# Patient Record
Sex: Male | Born: 1980 | Race: White | Hispanic: No | Marital: Married
Health system: Southern US, Community
[De-identification: ages and names within clinical notes are randomized; demographics above are authoritative.]

---

## 2015-10-15 ENCOUNTER — Emergency Department (HOSPITAL_COMMUNITY)
Admission: EM | Admit: 2015-10-15 | Discharge: 2015-10-15 | Disposition: A | Discharge status: Home / Self Care | Payer: Self-pay | Attending: Emergency Medicine | Admitting: Emergency Medicine

## 2015-10-15 ENCOUNTER — Encounter (HOSPITAL_COMMUNITY): Payer: Self-pay | Admitting: Emergency Medicine

## 2015-10-15 ENCOUNTER — Emergency Department (HOSPITAL_COMMUNITY): Payer: Self-pay

## 2015-10-15 DIAGNOSIS — Y999 Unspecified external cause status: Secondary | ICD-10-CM | POA: Insufficient documentation

## 2015-10-15 DIAGNOSIS — M5116 Intervertebral disc disorders with radiculopathy, lumbar region: Secondary | ICD-10-CM | POA: Insufficient documentation

## 2015-10-15 DIAGNOSIS — Y929 Unspecified place or not applicable: Secondary | ICD-10-CM | POA: Insufficient documentation

## 2015-10-15 DIAGNOSIS — Y9389 Activity, other specified: Secondary | ICD-10-CM | POA: Insufficient documentation

## 2015-10-15 DIAGNOSIS — M5126 Other intervertebral disc displacement, lumbar region: Secondary | ICD-10-CM

## 2015-10-15 DIAGNOSIS — M5136 Other intervertebral disc degeneration, lumbar region: Secondary | ICD-10-CM | POA: Insufficient documentation

## 2015-10-15 DIAGNOSIS — Z791 Long term (current) use of non-steroidal anti-inflammatories (NSAID): Secondary | ICD-10-CM | POA: Insufficient documentation

## 2015-10-15 DIAGNOSIS — Z79899 Other long term (current) drug therapy: Secondary | ICD-10-CM | POA: Insufficient documentation

## 2015-10-15 MED ORDER — ONDANSETRON HCL 8 MG PO TABS: 8.0000 mg | ORAL_TABLET | Freq: Three times a day (TID) | ORAL | Status: AC | PRN

## 2015-10-15 MED ORDER — PREDNISONE 20 MG PO TABS: 20.0000 mg | ORAL_TABLET | Freq: Two times a day (BID) | ORAL | Status: AC

## 2015-10-15 MED ORDER — OXYCODONE-ACETAMINOPHEN 5-325 MG PO TABS: 1.0000 | ORAL_TABLET | ORAL | Status: AC | PRN
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
## 2015-10-15 NOTE — ED Notes (Signed)
Pt states he does not take narcotic pain meds due to how nauseated they make him.

## 2015-10-15 NOTE — ED Notes (Signed)
Patient being transported to MRI at this time.

## 2015-10-15 NOTE — Discharge Instructions (Signed)
Use heat on her back, to help the discomfort. Do not drive when taking the pain medication. After the prednisone runs out, start ibuprofen, 400 mg 3 times a day to help with the pain. Call the neurosurgeon, for a follow-up appointment for further care.    Herniated Disk A herniated disk occurs when a disk in your spine bulges out too far. This condition is also called a ruptured disk or slipped disk. Your spine (backbone) is made up of bones called vertebrae. Between each pair of vertebrae is an oval disk with a soft, spongy center that acts as a shock absorber when you move. The spongy center is surrounded by a tough outer ring. When you have a herniated disk, the spongy center of the disk bulges out or ruptures through the outer ring. A herniated disk can press on a nerve between your vertebrae and cause pain. A herniated disk can occur anywhere in your back or neck area, but the lower back is the most common spot. CAUSES  In many cases, a herniated disk occurs just from getting older. As you age, the spongy insides of your disks tend to shrink and dry out. A herniated disk can result from gradual wear and tear. Injury or sudden strain can also cause a herniated disk.  RISK FACTORS Aging is the main risk factor for a herniated disk. Other risk factors include: 1. Being a man between the ages of 50 and 89 years. 2. Having a job that requires heavy lifting, bending, or twisting. 3. Having a job that requires long hours of driving. 4. Not getting enough exercise. 5. Being overweight. 6. Smoking. SIGNS AND SYMPTOMS  Signs and symptoms depend on which disk is herniated. 1. For a herniated disk in the lower back, you may have sharp pain in: 1. One part of your leg, hip, or buttocks. 2. The back of your calf. 3. The top or sole of your foot (sciatica).  2. For a herniated disk in the neck, you may feel pain: 1. When you move your neck. 2. Near or over your shoulder blade. 3. That moves to  your upper arm, forearm, or fingers.  3. You may also have muscle weakness. It may be hard to: 1. Lift your leg or arm. 2. Stand on your toes. 3. Squeeze tightly with one of your hands. 4. Other symptoms can include: 1. Numbness or tingling in the affected areas of your body. 2. Loss of bladder or bowel control. This is a rare but serious sign of a severe herniated disk in the lower back. DIAGNOSIS  Your health care provider will do a physical exam. During this exam, you may have to move certain body parts or assume various positions. For example, your health care provider may do the straight-leg test. This is a good way to test for a herniated disk in your lower back. In this test, the health care provider lifts your leg while you lie on your back. This is to see if you feel pain down your leg. Your health care provider will also check for numbness or loss of feeling. 1. Your health care provider will also check your: 1. Reflexes. 2. Muscle strength. 3. Posture. 2. Other tests may be done to help in making a diagnosis. These may include: 1. An X-ray of the spine to rule out other causes of back pain.  2. Other imaging studies, such as an MRI or CT scan. This is to check whether the herniated disk is pressing  on your spinal canal. 3. Electromyography (EMG). This test checks the nerves that control muscles. It is sometimes used to identify the specific area of nerve involvement.  TREATMENT  In many cases, herniated disk symptoms go away over a period of days or weeks. You will most likely be free of symptoms in 3-4 months. Treatment may include the following: 1. The initial treatment for a herniated disk is ashort period of rest. 1. Bed rest is often limited to 1 or 2 days. Resting for too long delays recovery. 2. If you have a herniated disk in your lower back, you should avoid sitting as much as possible because sitting increases pressure on the disk. 2. Medicines. These may include:   1. Nonsteroidal anti-inflammatory drugs (NSAIDs). 2. Muscle relaxants for back spasms. 3. Narcotic pain medicine if your pain is very bad.  3. Steroid injections. You may need these along the involved nerve root to help control pain. The steroid is injected in the area of the herniated disk. It helps by reducing swelling around the disk. 4. Physical therapy. This may include exercises to strengthen the muscles that help support your spine.  5. You may need surgery if other treatments do not work.  HOME CARE INSTRUCTIONS Follow all your health care provider's instructions. These may include: 1. Take all medicines as directed by your health care provider. 2. Rest for 2 days and then start moving. 1. Do not sit or stand for long periods of time. 2. Maintain good posture when sitting and standing. 3. Avoid movements that cause pain, such as bending or lifting. 3. When you are able to start lifting things again: 1. Bend with your knees. 2. Keep your back straight. 3. Hold heavy objects close to your body. 4. If you are overweight, ask your health care provider to help you start a weight-loss program. 5. When you are able to start exercising, ask your health care provider how much and what type of exercise is best for you. 6. Work with a physical therapist on stretching and strengthening exercises for your back. 7. Do not wear high-heeled shoes. 8. Do not sleep on your belly. 9. Do not smoke. 10. Keep all follow-up visits as directed by your health care provider. SEEK MEDICAL CARE IF: 1. You have back or neck pain that is not getting better after 4 weeks. 2. You have very bad pain in your back or neck. 3. You develop numbness, tingling, or weakness along with pain. SEEK IMMEDIATE MEDICAL CARE IF:  1. You have numbness, tingling, or weakness that makes you unable to use your arms or legs. 2. You lose control of your bladder or bowels. 3. You have dizziness or fainting. 4. You have  shortness of breath.  MAKE SURE YOU:   Understand these instructions.  Will watch your condition.  Will get help right away if you are not doing well or get worse.   This information is not intended to replace advice given to you by your health care provider. Make sure you discuss any questions you have with your health care provider.   Document Released: 04/28/2000 Document Revised: 05/22/2014 Document Reviewed: 04/04/2013 Elsevier Interactive Patient Education 2016 [employer] Inc[employer]  Degenerative Disk Disease Degenerative disk disease is a condition caused by the changes that occur in spinal disks as you grow older. Spinal disks are soft and compressible disks located between the bones of your spine (vertebrae). These disks act like shock absorbers. Degenerative disk disease can affect the whole spine.  However, the neck and lower back are most commonly affected. Many changes can occur in the spinal disks with aging, such as: 7. The spinal disks may dry and shrink. 8. Small tears may occur in the tough, outer covering of the disk (annulus). 9. The disk space may become smaller due to loss of water. 10. Abnormal growths in the bone (spurs) may occur. This can put pressure on the nerve roots exiting the spinal canal, causing pain. 11. The spinal canal may become narrowed. RISK FACTORS  5. Being overweight. 6. Having a family history of degenerative disk disease. 7. Smoking. 8. There is increased risk if you are doing heavy lifting or have a sudden injury. SIGNS AND SYMPTOMS  Symptoms vary from person to person and may include: 3. Pain that varies in intensity. Some people have no pain, while others have severe pain. The location of the pain depends on the part of your backbone that is affected. 1. You will have neck or arm pain if a disk in the neck area is affected. 2. You will have pain in your back, buttocks, or legs if a disk in the lower back is affected. 4. Pain that becomes worse  while bending, reaching up, or with twisting movements. 5. Pain that may start gradually and then get worse as time passes. It may also start after a major or minor injury. 6. Numbness or tingling in the arms or legs. DIAGNOSIS  Your health care provider will ask you about your symptoms and about activities or habits that may cause the pain. He or she may also ask about any injuries, diseases, or treatments you have had. Your health care provider will examine you to check for the range of movement that is possible in the affected area, to check for strength in your extremities, and to check for sensation in the areas of the arms and legs supplied by different nerve roots. You may also have:  6. An X-ray of the spine. 7. Other imaging tests, such as MRI. TREATMENT  Your health care provider will advise you on the best plan for treatment. Treatment may include: 11. Medicines. 12. Rehabilitation exercises. HOME CARE INSTRUCTIONS  4. Follow proper lifting and walking techniques as advised by your health care provider. 5. Maintain good posture. 6. Exercise regularly as advised by your health care provider. 7. Perform relaxation exercises. 8. Change your sitting, standing, and sleeping habits as advised by your health care provider. 9. Change positions frequently. 10. Lose weight or maintain a healthy weight as advised by your health care provider. 11. Do not use any tobacco products, including cigarettes, chewing tobacco, or electronic cigarettes. If you need help quitting, ask your health care provider. 12. Wear supportive footwear. 13. Take medicines only as directed by your health care provider. SEEK MEDICAL CARE IF:  5. Your pain does not go away within 1-4 weeks. 6. You have significant appetite or weight loss. SEEK IMMEDIATE MEDICAL CARE IF:   Your pain is severe.  You notice weakness in your arms, hands, or legs.  You begin to lose control of your bladder or bowel movements.  You  have fevers or night sweats. MAKE SURE YOU:   Understand these instructions.  Will watch your condition.  Will get help right away if you are not doing well or get worse.   This information is not intended to replace advice given to you by your health care provider. Make sure you discuss any questions you have with your health  care provider.   Document Released: 02/26/2007 Document Revised: 05/22/2014 Document Reviewed: 09/02/2013 Elsevier Interactive Patient Education 2016 [employer] Inc[employer]  Back Exercises The following exercises strengthen the muscles that help to support the back. They also help to keep the lower back flexible. Doing these exercises can help to prevent back pain or lessen existing pain. If you have back pain or discomfort, try doing these exercises 2-3 times each day or as told by your health care provider. When the pain goes away, do them once each day, but increase the number of times that you repeat the steps for each exercise (do more repetitions). If you do not have back pain or discomfort, do these exercises once each day or as told by your health care provider. EXERCISES Single Knee to Chest Repeat these steps 3-5 times for each leg: 12. Lie on your back on a firm bed or the floor with your legs extended. 13. Bring one knee to your chest. Your other leg should stay extended and in contact with the floor. 14. Hold your knee in place by grabbing your knee or thigh. 15. Pull on your knee until you feel a gentle stretch in your lower back. 16. Hold the stretch for 10-30 seconds. 17. Slowly release and straighten your leg. Pelvic Tilt Repeat these steps 5-10 times: 9. Lie on your back on a firm bed or the floor with your legs extended. 10. Bend your knees so they are pointing toward the ceiling and your feet are flat on the floor. 11. Tighten your lower abdominal muscles to press your lower back against the floor. This motion will tilt your pelvis so your tailbone  points up toward the ceiling instead of pointing to your feet or the floor. 12. With gentle tension and even breathing, hold this position for 5-10 seconds. Cat-Cow Repeat these steps until your lower back becomes more flexible: 7. Get into a hands-and-knees position on a firm surface. Keep your hands under your shoulders, and keep your knees under your hips. You may place padding under your knees for comfort. 8. Let your head hang down, and point your tailbone toward the floor so your lower back becomes rounded like the back of a cat. 9. Hold this position for 5 seconds. 10. Slowly lift your head and point your tailbone up toward the ceiling so your back forms a sagging arch like the back of a cow. 11. Hold this position for 5 seconds. Press-Ups Repeat these steps 5-10 times: 8. Lie on your abdomen (face-down) on the floor. 9. Place your palms near your head, about shoulder-width apart. 10. While you keep your back as relaxed as possible and keep your hips on the floor, slowly straighten your arms to raise the top half of your body and lift your shoulders. Do not use your back muscles to raise your upper torso. You may adjust the placement of your hands to make yourself more comfortable. 11. Hold this position for 5 seconds while you keep your back relaxed. 12. Slowly return to lying flat on the floor. Bridges Repeat these steps 10 times: 13. Lie on your back on a firm surface. 14. Bend your knees so they are pointing toward the ceiling and your feet are flat on the floor. 15. Tighten your buttocks muscles and lift your buttocks off of the floor until your waist is at almost the same height as your knees. You should feel the muscles working in your buttocks and the back of your thighs. If you  do not feel these muscles, slide your feet 1-2 inches farther away from your buttocks. 16. Hold this position for 3-5 seconds. 17. Slowly lower your hips to the starting position, and allow your buttocks  muscles to relax completely. If this exercise is too easy, try doing it with your arms crossed over your chest. Abdominal Crunches Repeat these steps 5-10 times: 14. Lie on your back on a firm bed or the floor with your legs extended. 15. Bend your knees so they are pointing toward the ceiling and your feet are flat on the floor. 16. Cross your arms over your chest. 17. Tip your chin slightly toward your chest without bending your neck. 18. Tighten your abdominal muscles and slowly raise your trunk (torso) high enough to lift your shoulder blades a tiny bit off of the floor. Avoid raising your torso higher than that, because it can put too much stress on your low back and it does not help to strengthen your abdominal muscles. 19. Slowly return to your starting position. Back Lifts Repeat these steps 5-10 times: 7. Lie on your abdomen (face-down) with your arms at your sides, and rest your forehead on the floor. 8. Tighten the muscles in your legs and your buttocks. 9. Slowly lift your chest off of the floor while you keep your hips pressed to the floor. Keep the back of your head in line with the curve in your back. Your eyes should be looking at the floor. 10. Hold this position for 3-5 seconds. 11. Slowly return to your starting position. SEEK MEDICAL CARE IF:  Your back pain or discomfort gets much worse when you do an exercise.  Your back pain or discomfort does not lessen within 2 hours after you exercise. If you have any of these problems, stop doing these exercises right away. Do not do them again unless your health care provider says that you can. SEEK IMMEDIATE MEDICAL CARE IF:  You develop sudden, severe back pain. If this happens, stop doing the exercises right away. Do not do them again unless your health care provider says that you can.   This information is not intended to replace advice given to you by your health care provider. Make sure you discuss any questions you have  with your health care provider.   Document Released: 06/08/2004 Document Revised: 01/20/2015 Document Reviewed: 06/25/2014 Elsevier Interactive Patient Education [employer]

## 2015-10-15 NOTE — ED Notes (Signed)
Patient still in MRI at this time; visitors at bedside

## 2015-10-15 NOTE — ED Notes (Signed)
Pt reports years of hx of sciatica; swelling and numbness on right side. Has iced and heated and taken ibuprofen. No PCP to manage.

## 2015-10-15 NOTE — ED Notes (Signed)
Patient has returned from being out of the department; patient placed on continuous pulse oximetry and blood pressure cuff
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
## 2019-06-23 ENCOUNTER — Ambulatory Visit (HOSPITAL_COMMUNITY)
Admission: RE | Admit: 2019-06-23 | Discharge: 2019-06-23 | Disposition: A | Discharge status: Home / Self Care | Payer: Self-pay | Attending: Psychiatry | Admitting: Psychiatry

## 2019-06-23 ENCOUNTER — Encounter (HOSPITAL_COMMUNITY): Payer: Self-pay | Admitting: Psychiatry

## 2019-06-23 DIAGNOSIS — F319 Bipolar disorder, unspecified: Secondary | ICD-10-CM | POA: Insufficient documentation

## 2019-06-23 DIAGNOSIS — Z1389 Encounter for screening for other disorder: Secondary | ICD-10-CM | POA: Insufficient documentation
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
## 2019-06-23 NOTE — H&P (Signed)
Behavioral Health Medical Screening Exam  Eric Lawson is an 39 y.o. male patient presents to [facility] as a walk in with complaints of having trouble sleeping.  Patient states that he had outpatient psychiatric services at [facility] and was last seen there January 2021 before moving to [place].  States that he has recently moved back to [place] and would like medication to help him sleep.  Reports that he has tried several over the counter medications and nothing has helped.  Patient denies suicidal/self-harm/homicidal ideation, psychosis, and paranoia.  Patient lives with his wife who is supportive and is employed as a Corporate investment banker.     During evaluation Eric Lawson is alert/oriented x 4; calm/cooperative; and mood is congruent with affect.  He does not appear to be responding to internal/external stimuli or delusional thoughts.  Patient denies suicidal/self-harm/homicidal ideation, psychosis, and paranoia.  Patient answered question appropriately.  Patient given resources for outpatient psychiatric services.  Encouraged to return to [facility] until another facility he like became available.  Patient also encouraged to apply for the [facility] card.    Total Time spent with patient: 30 minutes  Psychiatric Specialty Exam: Physical Exam  Vitals reviewed. Constitutional: He is oriented to person, place, and time. He appears well-developed and well-nourished. No distress.  Respiratory: Effort normal.  Musculoskeletal:        General: Normal range of motion.  Neurological: He is alert and oriented to person, place, and time.  Skin: Skin is warm and dry.  Psychiatric: He has a normal mood and affect. His speech is normal and behavior is normal. Judgment normal. He is not actively hallucinating. Thought content is not paranoid. Cognition and memory are normal. He expresses no homicidal and no suicidal ideation.    Review of Systems  Psychiatric/Behavioral: Agitation: Denies. Behavioral problem:  Denies. Decreased concentration: Denies. Hallucinations: Denies. Self-injury: denies. Sleep disturbance: Report he is having trouble sleep.  States last slept for 2 hours 4 days ago. Suicidal ideas: denies. The patient is nervous/anxious (Stable).        Patient reports a history of bipolar disorder was last seen at [facility] January 2021; was on Seroquel 200 mg Q hs but stopped it himself because "made me feel strange"  All other systems reviewed and are negative.   Blood pressure 122/66, pulse 100, temperature 98 F (36.7 C), temperature source Oral, resp. rate 18, SpO2 98 %.There is no height or weight on file to calculate BMI.  General Appearance: Casual and Neat  Eye Contact:  Good  Speech:  Clear and Coherent and Normal Rate  Volume:  Normal  Mood:  Anxious and Stable  Affect:  Appropriate and Congruent  Thought Process:  Coherent, Goal Directed and Descriptions of Associations: Intact  Orientation:  Full (Time, Place, and Person)  Thought Content:  WDL and Logical  Suicidal Thoughts:  No  Homicidal Thoughts:  No  Memory:  Immediate;   Good Recent;   Good  Judgement:  Intact  Insight:  Present  Psychomotor Activity:  Normal  Concentration: Concentration: Good and Attention Span: Good  Recall:  Good  Fund of Knowledge:Good  Language: Good  Akathisia:  No  Handed:  Right  AIMS (if indicated):     Assets:  Communication Skills Desire for Improvement Housing Social Support  Sleep:       Musculoskeletal: Strength & Muscle Tone: within normal limits Gait & Station: normal Patient leans: N/A  Recommendations:  Outpatient psychiatric services  Based on my evaluation the patient  does not appear to have an emergency medical condition.   Disposition: Patient psychiatric cleared No evidence of imminent risk to self or others at present.   Patient does not meet criteria for psychiatric inpatient admission. Supportive therapy provided about ongoing stressors. Discussed crisis plan,  support from social network, calling 911, coming to the Emergency Department, and calling Suicide Hotline.   Follow-up Information    [facility] [place]. Call.   Why: Call prior to going to set up appointment time Contact information: [address] [place] [address] [phone]          Resources give for outpatient psychiatric services and an application for [facility] Xylina Rhoads, NP 06/23/2019, 2:34 PM
